# Patient Record
Sex: Female | Born: 1995 | Race: Black or African American | Hispanic: No | Marital: Single | Smoking: Never smoker
Health system: Southern US, Community
[De-identification: ages and names within clinical notes are randomized; demographics above are authoritative.]

---
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
## 2017-11-27 ENCOUNTER — Emergency Department (HOSPITAL_COMMUNITY)
Admission: EM | Admit: 2017-11-27 | Discharge: 2017-11-27 | Disposition: A | Discharge status: Home / Self Care | Payer: Self-pay | Attending: Emergency Medicine | Admitting: Emergency Medicine

## 2017-11-27 ENCOUNTER — Other Ambulatory Visit: Payer: Self-pay

## 2017-11-27 ENCOUNTER — Encounter (HOSPITAL_COMMUNITY): Payer: Self-pay

## 2017-11-27 ENCOUNTER — Emergency Department (HOSPITAL_COMMUNITY): Payer: Self-pay

## 2017-11-27 DIAGNOSIS — Y999 Unspecified external cause status: Secondary | ICD-10-CM | POA: Insufficient documentation

## 2017-11-27 DIAGNOSIS — Y9389 Activity, other specified: Secondary | ICD-10-CM | POA: Insufficient documentation

## 2017-11-27 DIAGNOSIS — Y92009 Unspecified place in unspecified non-institutional (private) residence as the place of occurrence of the external cause: Secondary | ICD-10-CM | POA: Insufficient documentation

## 2017-11-27 DIAGNOSIS — S0083XA Contusion of other part of head, initial encounter: Secondary | ICD-10-CM | POA: Insufficient documentation

## 2017-11-27 NOTE — Discharge Instructions (Addendum)
You can take tylenol or ibuprofen, available over the counter as needed for pain.  Eat a soft diet until your jaw pain resolves.
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
